# Patient Record
Sex: Male | Born: 1990 | Race: White | Hispanic: No | Marital: Single | Smoking: Current every day smoker
Health system: Southern US, Community
[De-identification: ages and names within clinical notes are randomized; demographics above are authoritative.]

## PROBLEM LIST (undated history)

## (undated) DIAGNOSIS — K529 Noninfective gastroenteritis and colitis, unspecified: Secondary | ICD-10-CM

## (undated) HISTORY — PX: HERNIA REPAIR: SHX51

---

## 2017-07-16 ENCOUNTER — Encounter (HOSPITAL_COMMUNITY): Payer: Self-pay

## 2017-07-16 ENCOUNTER — Emergency Department (HOSPITAL_COMMUNITY): Payer: Managed Care, Other (non HMO)

## 2017-07-16 ENCOUNTER — Emergency Department (HOSPITAL_COMMUNITY)
Admission: EM | Admit: 2017-07-16 | Discharge: 2017-07-16 | Disposition: A | Discharge status: Home / Self Care | Payer: Managed Care, Other (non HMO) | Attending: Emergency Medicine | Admitting: Emergency Medicine

## 2017-07-16 DIAGNOSIS — Z79899 Other long term (current) drug therapy: Secondary | ICD-10-CM | POA: Insufficient documentation

## 2017-07-16 DIAGNOSIS — R110 Nausea: Secondary | ICD-10-CM | POA: Insufficient documentation

## 2017-07-16 DIAGNOSIS — R1031 Right lower quadrant pain: Secondary | ICD-10-CM | POA: Insufficient documentation

## 2017-07-16 DIAGNOSIS — F17200 Nicotine dependence, unspecified, uncomplicated: Secondary | ICD-10-CM | POA: Diagnosis not present

## 2017-07-16 HISTORY — DX: Noninfective gastroenteritis and colitis, unspecified: K52.9

## 2017-07-16 LAB — COMPREHENSIVE METABOLIC PANEL
ALBUMIN: 4.6 g/dL (ref 3.5–5.0)
ALT: 24 U/L (ref 17–63)
ANION GAP: 10 (ref 5–15)
AST: 25 U/L (ref 15–41)
Alkaline Phosphatase: 76 U/L (ref 38–126)
BUN: 10 mg/dL (ref 6–20)
CHLORIDE: 107 mmol/L (ref 101–111)
CO2: 24 mmol/L (ref 22–32)
Calcium: 9.7 mg/dL (ref 8.9–10.3)
Creatinine, Ser: 0.83 mg/dL (ref 0.61–1.24)
GFR calc Af Amer: >60 mL/min (ref >60)
GFR calc non Af Amer: >60 mL/min (ref >60)
GLUCOSE: 98 mg/dL (ref 65–99)
POTASSIUM: 4.1 mmol/L (ref 3.5–5.1)
SODIUM: 141 mmol/L (ref 135–145)
Total Bilirubin: 0.6 mg/dL (ref 0.3–1.2)
Total Protein: 7.7 g/dL (ref 6.5–8.1)

## 2017-07-16 LAB — URINALYSIS, ROUTINE W REFLEX MICROSCOPIC
BILIRUBIN URINE: NEGATIVE
GLUCOSE, UA: NEGATIVE mg/dL
Hgb urine dipstick: NEGATIVE
KETONES UR: NEGATIVE mg/dL
LEUKOCYTES UA: NEGATIVE
Nitrite: NEGATIVE
PH: 7 (ref 5.0–8.0)
Protein, ur: NEGATIVE mg/dL
SPECIFIC GRAVITY, URINE: 1.023 (ref 1.005–1.030)

## 2017-07-16 LAB — CBC
HEMATOCRIT: 41.6 % (ref 39.0–52.0)
HEMOGLOBIN: 14.9 g/dL (ref 13.0–17.0)
MCH: 30.6 pg (ref 26.0–34.0)
MCHC: 35.8 g/dL (ref 30.0–36.0)
MCV: 85.4 fL (ref 78.0–100.0)
Platelets: 333 10*3/uL (ref 150–400)
RBC: 4.87 MIL/uL (ref 4.22–5.81)
RDW: 12.8 % (ref 11.5–15.5)
WBC: 12 10*3/uL — ABNORMAL HIGH (ref 4.0–10.5)

## 2017-07-16 LAB — LIPASE, BLOOD: LIPASE: 24 U/L (ref 11–51)

## 2017-07-16 MED ORDER — ONDANSETRON HCL 4 MG PO TABS: 4.0000 mg | ORAL_TABLET | Freq: Three times a day (TID) | ORAL | 0 refills | Status: AC | PRN

## 2017-07-16 MED ORDER — HYDROCODONE-ACETAMINOPHEN 5-325 MG PO TABS: 1.0000 | ORAL_TABLET | ORAL | 0 refills | Status: DC | PRN

## 2017-07-16 MED ORDER — IOHEXOL 300 MG/ML  SOLN: 100.0000 mL | Freq: Once | INTRAMUSCULAR | Status: DC | PRN

## 2017-07-16 MED ORDER — ONDANSETRON 4 MG PO TBDP
4.0000 mg | ORAL_TABLET | Freq: Once | ORAL | Status: AC | PRN
  Administered 2017-07-16: 4 mg via ORAL
  Filled 2017-07-16: qty 1

## 2017-07-16 MED ORDER — IOHEXOL 300 MG/ML  SOLN
100.0000 mL | Freq: Once | INTRAMUSCULAR | Status: AC | PRN
  Administered 2017-07-16: 100 mL via INTRAVENOUS

## 2017-07-16 NOTE — ED Provider Notes (Signed)
Patient placed in Quick Look pathway, seen and evaluated   Chief Complaint: Lower abdominal pain and headache  HPI: Patient with history of hernia surgery, colitis with last flare 6 years ago --presents with acute onset of right lower quadrant pain.  Pain is sharp and shooting in nature.  It does not radiate.  It became worse this morning.  [facility] and was encouraged to come to the emergency department for rule out appendicitis.  Patient has nausea but no vomiting.  No fevers, urinary symptoms.  He also complains of a generalized headache, similar to previous.  He states that he typically will take Excedrin when he gets headaches.  ROS:  Positive ROS: (+) Abdominal pain, nausea, headache Negative ROS: (-) Vomiting, dysuria, hematuria, diarrhea  Physical Exam:   Gen: No distress  Neuro: Awake and Alert  Skin: Warm    Focused Exam: Heart RRR, nml S1,S2, no m/r/g; Eyes PERRL; Lungs CTAB; Abd soft, mild right lower quadrant pain, no rebound or guarding; Ext 2+ pedal pulses bilaterally, no edema.  BP 131/86 (BP Location: Right Arm)   Pulse 82   Temp 97.7 F (36.5 C) (Oral)   Resp 18   Ht  (1.753 m)   Wt 79.4 kg (175 lb)   SpO2 100%   BMI 25.84 kg/m   Plan: Pain is somewhat atypical for appendicitis however given patient's history of colitis and hernia repair, feel CT imaging is warranted.  CT ordered.  Labs pending.   Initiation of care has begun. The patient has been counseled on the process, plan, and necessity for staying for the completion/evaluation, and the remainder of the medical screening examination    Renne Crigler, Cordelia Poche 07/16/17 1532    Eber Hong, MD 07/17/17 [phone]

## 2017-07-16 NOTE — ED Provider Notes (Signed)
[facility] EMERGENCY DEPARTMENT Provider Note   CSN: [id number] Arrival date & time: 07/16/17  1456     History   Chief Complaint Chief Complaint  Patient presents with  . Abdominal Pain    HPI Lance Foster is a 27 y.o. male.   The history is provided by the patient and medical records.  Abdominal Pain   This is a new problem. The current episode started 12 to 24 hours ago. The problem occurs constantly. The problem has been resolved. The pain is associated with an unknown factor. The pain is located in the RLQ. The quality of the pain is aching and sharp. The pain is moderate. Associated symptoms include nausea. Pertinent negatives include anorexia, fever, diarrhea, vomiting, constipation, dysuria, frequency and hematuria. Nothing aggravates the symptoms. His past medical history does not include GERD or Crohn's disease.    Past Medical History:  Diagnosis Date  . Colitis     There are no active problems to display for this patient.   Past Surgical History:  Procedure Laterality Date  . HERNIA REPAIR          Home Medications    Prior to Admission medications   Medication Sig Start Date End Date Taking? Authorizing Provider  aspirin-acetaminophen-caffeine (EXCEDRIN MIGRAINE) [phone] MG tablet Take 1-2 tablets by mouth every 6 (six) hours as needed (for pain or headaches).    Yes [provider]  multivitamin (ONE-A-DAY MEN'S) TABS tablet Take 1 tablet by mouth daily.   Yes [provider]    Family History History reviewed. No pertinent family history.    Social History Social History   Tobacco Use  . Smoking status: Current Every Day Smoker  . Smokeless tobacco: Never Used  Substance Use Topics  . Alcohol use: Not on file  . Drug use: Not on file     Allergies   Patient has no known allergies.   Review of Systems Review of Systems  Constitutional: Negative for chills, diaphoresis, fatigue and fever.    HENT: Negative for congestion and rhinorrhea.   Respiratory: Negative for cough, choking, chest tightness, shortness of breath and wheezing.   Cardiovascular: Negative for chest pain and palpitations.  Gastrointestinal: Positive for abdominal pain and nausea. Negative for anorexia, constipation, diarrhea and vomiting.  Genitourinary: Negative for dysuria, flank pain, frequency and hematuria.  Musculoskeletal: Negative for back pain, neck pain and neck stiffness.  Skin: Negative for rash and wound.  Neurological: Negative for dizziness, weakness, light-headedness and numbness.  Psychiatric/Behavioral: Negative for agitation and confusion.  All other systems reviewed and are negative.    Physical Exam Updated Vital Signs BP (!) 147/106   Pulse (!) 53   Temp 98.5 F (36.9 C) (Oral)   Resp 16   Ht  (1.753 m)   Wt 79.4 kg (175 lb)   SpO2 99%   BMI 25.84 kg/m   Physical Exam  Constitutional: He is oriented to person, place, and time. He appears well-developed and well-nourished.  Non-toxic appearance. He does not appear ill. No distress.  HENT:  Head: Normocephalic.  Nose: Nose normal.  Mouth/Throat: Oropharynx is clear and moist. No oropharyngeal exudate.  Eyes: Pupils are equal, round, and reactive to light. Conjunctivae and EOM are normal.  Neck: Normal range of motion.  Cardiovascular: Normal rate and intact distal pulses.  No murmur heard. Pulmonary/Chest: Effort normal and breath sounds normal. No respiratory distress. He has no wheezes. He exhibits no tenderness.  Abdominal: Soft. Bowel sounds  are normal. He exhibits no distension. There is no tenderness. There is no rebound and no guarding.  Genitourinary:  Genitourinary Comments: Refused   Musculoskeletal: He exhibits no edema or tenderness.  Neurological: He is alert and oriented to person, place, and time. No sensory deficit. He exhibits normal muscle tone.  Skin: Skin is warm. Capillary refill takes less than 2  seconds. He is not diaphoretic. No erythema. No pallor.  Psychiatric: He has a normal mood and affect.  Nursing note and vitals reviewed.    ED Treatments / Results  Labs (all labs ordered are listed, but only abnormal results are displayed) Labs Reviewed  CBC - Abnormal; Notable for the following components:      Result Value   WBC 12.0 (*)    All other components within normal limits  LIPASE, BLOOD  COMPREHENSIVE METABOLIC PANEL  URINALYSIS, ROUTINE W REFLEX MICROSCOPIC    EKG None  Radiology Ct Abdomen Pelvis W Contrast  Result Date: 07/16/2017 CLINICAL DATA:  RIGHT lower quadrant pain. Evaluate for appendicitis. EXAM: CT ABDOMEN AND PELVIS WITH CONTRAST TECHNIQUE: Multidetector CT imaging of the abdomen and pelvis was performed using the standard protocol following bolus administration of intravenous contrast. CONTRAST:  OMNIPAQUE IOHEXOL 300 MG/ML  SOLN COMPARISON:  None. FINDINGS: Lower chest: No acute abnormality. Hepatobiliary: No focal liver abnormality is seen. No gallstones, gallbladder wall thickening, or biliary dilatation. Pancreas: Unremarkable. No pancreatic ductal dilatation or surrounding inflammatory changes. Spleen: Normal in size without focal abnormality. Adrenals/Urinary Tract: Adrenal glands are unremarkable. Kidneys are normal, without renal calculi, focal lesion, or hydronephrosis. Bladder is unremarkable. Stomach/Bowel: Stomach is within normal limits. Appendix appears normal. No evidence of bowel wall thickening, distention, or inflammatory changes. Changes of colonic diverticulosis without diverticulitis. Vascular/Lymphatic: No significant vascular findings are present. No enlarged abdominal or pelvic lymph nodes. Reproductive: Prostate is unremarkable. Other: No abdominal wall hernia or abnormality. No abdominopelvic ascites. Musculoskeletal: No acute or significant osseous findings. IMPRESSION: No evidence for appendicitis. No acute intra-abdominal or  pelvic findings. Electronically Signed   By: Elsie Stain M.D.   On: 07/16/2017 18:46    Procedures Procedures (including critical care time)  Medications Ordered in ED Medications  iohexol (OMNIPAQUE) 300 MG/ML solution 100 mL (has no administration in time range)  ondansetron (ZOFRAN-ODT) disintegrating tablet 4 mg (4 mg Oral Given 07/16/17 1520)  iohexol (OMNIPAQUE) 300 MG/ML solution 100 mL (100 mLs Intravenous Contrast Given 07/16/17 1756)     Initial Impression / Assessment and Plan / ED Course  I have reviewed the triage vital signs and the nursing notes.  Pertinent labs & imaging results that were available during my care of the patient were reviewed by me and considered in my medical decision making (see chart for details).     Lance Foster is a 27 y.o. male with a past medical history significant for prior colitis and prior hernia repair who presents with right lower quadrant abdominal pain.  Patient reports that he began having pain this morning that worsened at work.  He reports that he went to an urgent care and then was sent to the emergency department for appendicitis rule out.  Patient reports of nausea but no vomiting.  He denies fevers, chills, chest pain, shortness breath, constipation, or diarrhea.  He denies any urinary symptoms.  He denies recent trauma.  He described his pain is cramping and sharp in his right lower quadrant primarily.  He denies any testicle pain or groin pain.  On exam,  patient had no significant right lower quadrant tenderness.  Abdomen was otherwise nontender.  No CVA tenderness.  Lungs clear.  Chest nontender.  Patient had work-up started in triage including lab tests and CT scan to rule out meningitis.    Lab testing reassuring and CT scan showing no acute intra-abdominal or pelvic pathology.  No appendicitis, colitis, diverticulitis, or obstruction.  No other significant abnormality seen.    Patient reassessed and had no further pain.   Unclear etiology of the symptoms the patient does report he bends over and across her crawl spaces at work.  Possible muscular skeletal etiology of symptoms.  Given improvement in symptoms after medications, patient be given prescription for nausea medicine and several doses of pain medication.  Patient will follow with PCP and understood return precautions.  Patient no other questions or concerns and patient was discharged in good condition with improving symptoms.   Final Clinical Impressions(s) / ED Diagnoses   Final diagnoses:  Right lower quadrant abdominal pain  Nausea    ED Discharge Orders        Ordered    ondansetron (ZOFRAN) 4 MG tablet  Every 8 hours PRN     07/16/17 2117    HYDROcodone-acetaminophen (NORCO/VICODIN) 5-325 MG tablet  Every 4 hours PRN     07/16/17 2117      Clinical Impression: 1. Right lower quadrant abdominal pain   2. Nausea     Disposition: Discharge  Condition: Good  I have discussed the results, Dx and Tx plan with the pt(& family if present). He/she/they expressed understanding and agree(s) with the plan. Discharge instructions discussed at great length. Strict return precautions discussed and pt &/or family have verbalized understanding of the instructions. No further questions at time of discharge.    New Prescriptions   HYDROCODONE-ACETAMINOPHEN (NORCO/VICODIN) 5-325 MG TABLET    Take 1 tablet by mouth every 4 (four) hours as needed.   ONDANSETRON (ZOFRAN) 4 MG TABLET    Take 1 tablet (4 mg total) by mouth every 8 (eight) hours as needed.    Follow Up: [facility] [address] [place] [address] [phone] Schedule an appointment as soon as possible for a visit    [facility] EMERGENCY DEPARTMENT [address] [id number] mc [place] [address] [phone]       Clova Morlock, Canary Brim, MD 07/16/17 2126

## 2017-07-16 NOTE — Discharge Instructions (Signed)
Your imaging today was overall reassuring.  We do not feel you have an obstruction, infection, diverticulitis, appendicitis, or gallbladder troubles.  Please take the nausea medicine and pain medicine to help with your symptoms and stay hydrated.  Please follow-up with a primary care physician.  If any symptoms change or worsen, please return the nearest emergency department.

## 2017-07-16 NOTE — ED Triage Notes (Signed)
Pt presents with onset of RLQ abdominal pain that began this morning.  Pt reports nausea and headache; seen at urgent care and referred here to r/o appendicitis.

## 2017-07-19 ENCOUNTER — Encounter: Payer: Self-pay | Admitting: Urgent Care

## 2017-07-19 ENCOUNTER — Other Ambulatory Visit: Payer: Self-pay

## 2017-07-19 ENCOUNTER — Ambulatory Visit (INDEPENDENT_AMBULATORY_CARE_PROVIDER_SITE_OTHER): Payer: Managed Care, Other (non HMO) | Admitting: Urgent Care

## 2017-07-19 VITALS — BP 140/82 | HR 70 | Temp 98.0°F | Resp 16 | Ht 69.0 in | Wt 172.0 lb

## 2017-07-19 DIAGNOSIS — K5730 Diverticulosis of large intestine without perforation or abscess without bleeding: Secondary | ICD-10-CM | POA: Diagnosis not present

## 2017-07-19 DIAGNOSIS — R110 Nausea: Secondary | ICD-10-CM

## 2017-07-19 DIAGNOSIS — R1084 Generalized abdominal pain: Secondary | ICD-10-CM

## 2017-07-19 DIAGNOSIS — Z800 Family history of malignant neoplasm of digestive organs: Secondary | ICD-10-CM

## 2017-07-19 DIAGNOSIS — R197 Diarrhea, unspecified: Secondary | ICD-10-CM | POA: Diagnosis not present

## 2017-07-19 DIAGNOSIS — D72829 Elevated white blood cell count, unspecified: Secondary | ICD-10-CM

## 2017-07-19 DIAGNOSIS — F1010 Alcohol abuse, uncomplicated: Secondary | ICD-10-CM

## 2017-07-19 LAB — POCT CBC
GRANULOCYTE PERCENT: 58.5 % (ref 37–80)
HCT, POC: 41.3 % — AB (ref 43.5–53.7)
Hemoglobin: 13.8 g/dL — AB (ref 14.1–18.1)
LYMPH, POC: 2.7 (ref 0.6–3.4)
MCH, POC: 29.3 pg (ref 27–31.2)
MCHC: 33.5 g/dL (ref 31.8–35.4)
MCV: 87.6 fL (ref 80–97)
MID (CBC): 0.8 (ref 0–0.9)
MPV: 7.2 fL (ref 0–99.8)
PLATELET COUNT, POC: 321 10*3/uL (ref 142–424)
POC Granulocyte: 4.9 (ref 2–6.9)
POC LYMPH %: 32.4 % (ref 10–50)
POC MID %: 9.1 %M (ref 0–12)
RBC: 4.71 M/uL (ref 4.69–6.13)
RDW, POC: 12.7 %
WBC: 8.4 10*3/uL (ref 4.6–10.2)

## 2017-07-19 LAB — POCT URINALYSIS DIP (MANUAL ENTRY)
BILIRUBIN UA: NEGATIVE mg/dL
Bilirubin, UA: NEGATIVE
Glucose, UA: NEGATIVE mg/dL
LEUKOCYTES UA: NEGATIVE
Nitrite, UA: NEGATIVE
PH UA: 7 (ref 5.0–8.0)
Protein Ur, POC: NEGATIVE mg/dL
RBC UA: NEGATIVE
Spec Grav, UA: 1.02 (ref 1.010–1.025)
Urobilinogen, UA: 0.2 E.U./dL

## 2017-07-19 NOTE — Progress Notes (Signed)
MRN: [id number] DOB: [birth year]  Subjective:   L[facility] Foster is a 27 y.o. male presenting for nausea without vomiting, abdominal pain, loose stools. He has a history of diverticulosis without diverticulitis on 07/16/2017 as seen on abdominal CT. Patient has bouts of diarrhea and hard stools, constipation, has to strain at times to defecate. He has also had trouble with hemorrhoids. Patient just moved to the area ~1 year ago. He does not have a GI doctor. Admits that he is not actively eating healthily.  Denies fever, vomiting, bloody stools.  He does have family history, an uncle, that had colon cancer.  Is worried about his work as well given that he is had to miss some days due to his abdominal pain and may need FMLA paperwork.  Smoke cigarettes currently. Drinks a 24 pack of beer over 2-day weekend consistently.  His family history is also positive for diabetes and hyperlipidemia in his maternal grandfather.  Lance Foster has a current medication list which includes the following prescription(s): multivitamin and ondansetron. Also has No Known Allergies.  Lance Foster  has a past medical history of Colitis. Also  has a past surgical history that includes Hernia repair.  Objective:   Vitals: BP 140/82   Pulse 70   Temp 98 F (36.7 C) (Oral)   Resp 16   Ht  (1.753 m)   Wt 172 lb (78 kg)   SpO2 97%   BMI 25.40 kg/m   Physical Exam  Constitutional: He is oriented to person, place, and time. He appears well-developed and well-nourished.  HENT:  Mouth/Throat: Oropharynx is clear and moist.  Cardiovascular: Normal rate, regular rhythm and intact distal pulses. Exam reveals no gallop and no friction rub.  No murmur heard. Pulmonary/Chest: No respiratory distress. He has no wheezes. He has no rales.  Abdominal: There is no hepatosplenomegaly. There is tenderness in the right lower quadrant. There is no rigidity, no rebound, no guarding, no CVA tenderness, no tenderness at  McBurney's point and negative Murphy's sign.  Neurological: He is alert and oriented to person, place, and time.  Skin: Skin is warm and dry.  Psychiatric: He has a normal mood and affect.   Results for orders placed or performed in visit on 07/19/17 (from the past 24 hour(s))  POCT urinalysis dipstick     Status: None   Collection Time: 07/19/17  3:42 PM  Result Value Ref Range   Color, UA yellow yellow   Clarity, UA clear clear   Glucose, UA negative negative mg/dL   Bilirubin, UA negative negative   Ketones, POC UA negative negative mg/dL   Spec Grav, UA 4.098 1.191 - 1.025   Blood, UA negative negative   pH, UA 7.0 5.0 - 8.0   Protein Ur, POC negative negative mg/dL   Urobilinogen, UA 0.2 0.2 or 1.0 E.U./dL   Nitrite, UA Negative Negative   Leukocytes, UA Negative Negative  POCT CBC     Status: Abnormal   Collection Time: 07/19/17  4:20 PM  Result Value Ref Range   WBC 8.4 4.6 - 10.2 K/uL   Lymph, poc 2.7 0.6 - 3.4   POC LYMPH PERCENT 32.4 10 - 50 %L   MID (cbc) 0.8 0 - 0.9   POC MID % 9.1 0 - 12 %M   POC Granulocyte 4.9 2 - 6.9   Granulocyte percent 58.5 37 - 80 %G   RBC 4.71 4.69 - 6.13 M/uL   Hemoglobin 13.8 (A) 14.1 - 18.1 g/dL  HCT, POC 41.3 (A) 43.5 - 53.7 %   MCV 87.6 80 - 97 fL   MCH, POC 29.3 27 - 31.2 pg   MCHC 33.5 31.8 - 35.4 g/dL   RDW, POC 16.1 %   Platelet Count, POC 321 142 - 424 K/uL   MPV 7.2 0 - 99.8 fL    Assessment and Plan :   Diarrhea, unspecified type - Plan: POCT urinalysis dipstick, Ambulatory referral to Gastroenterology  Nausea without vomiting - Plan: Ambulatory referral to Gastroenterology  Generalized abdominal pain - Plan: Ambulatory referral to Gastroenterology, POCT CBC  Leukocytosis, unspecified type  Diverticulosis of colon - Plan: Ambulatory referral to Gastroenterology, POCT CBC  Family history of colon cancer  Alcohol abuse  Counseled on significant need for dietary modifications including a high-fiber diet,  adequate hydration and abstaining from alcohol.  Referral to GI is pending.  Return to clinic precautions discussed.  I'd be happy to help patient with his FMLA paperwork if he ends up needing this.  Wallis Bamberg, PA-C [facility] [phone] 07/19/2017  3:34 PM

## 2017-07-19 NOTE — Patient Instructions (Addendum)
Diverticulosis Diverticulosis is a condition that develops when small pouches (diverticula) form in the wall of the large intestine (colon). The colon is where water is absorbed and stool is formed. The pouches form when the inside layer of the colon pushes through weak spots in the outer layers of the colon. You may have a few pouches or many of them. What are the causes? The cause of this condition is not known. What increases the risk? The following factors may make you more likely to develop this condition:  Being older than age 80. Your risk for this condition increases with age. Diverticulosis is rare among people younger than age 64. By age 48, many people have it.  Eating a low-fiber diet.  Having frequent constipation.  Being overweight.  Not getting enough exercise.  Smoking.  Taking over-the-counter pain medicines, like aspirin and ibuprofen.  Having a family history of diverticulosis.  What are the signs or symptoms? In most people, there are no symptoms of this condition. If you do have symptoms, they may include:  Bloating.  Cramps in the abdomen.  Constipation or diarrhea.  Pain in the lower left side of the abdomen.  How is this diagnosed? This condition is most often diagnosed during an exam for other colon problems. Because diverticulosis usually has no symptoms, it often cannot be diagnosed independently. This condition may be diagnosed by:  Using a flexible scope to examine the colon (colonoscopy).  Taking an X-ray of the colon after dye has been put into the colon (barium enema).  Doing a CT scan.  How is this treated? You may not need treatment for this condition if you have never developed an infection related to diverticulosis. If you have had an infection before, treatment may include:  Eating a high-fiber diet. This may include eating more fruits, vegetables, and grains.  Taking a fiber supplement.  Taking a live bacteria supplement  (probiotic).  Taking medicine to relax your colon.  Taking antibiotic medicines.  Follow these instructions at home:  Drink 6-8 glasses of water or more each day to prevent constipation.  Try not to strain when you have a bowel movement.  If you have had an infection before: ? Eat more fiber as directed by your health care provider or your diet and nutrition specialist (dietitian). ? Take a fiber supplement or probiotic, if your health care provider approves.  Take over-the-counter and prescription medicines only as told by your health care provider.  If you were prescribed an antibiotic, take it as told by your health care provider. Do not stop taking the antibiotic even if you start to feel better.  Keep all follow-up visits as told by your health care provider. This is important. Contact a health care provider if:  You have pain in your abdomen.  You have bloating.  You have cramps.  You have not had a bowel movement in 3 days. Get help right away if:  Your pain gets worse.  Your bloating becomes very bad.  You have a fever or chills, and your symptoms suddenly get worse.  You vomit.  You have bowel movements that are bloody or black.  You have bleeding from your rectum. Summary  Diverticulosis is a condition that develops when small pouches (diverticula) form in the wall of the large intestine (colon).  You may have a few pouches or many of them.  This condition is most often diagnosed during an exam for other colon problems.  If you have had an  infection related to diverticulosis, treatment may include increasing the fiber in your diet, taking supplements, or taking medicines. This information is not intended to replace advice given to you by your health care provider. Make sure you discuss any questions you have with your health care provider. Document Released: 11/24/2003 Document Revised: 01/16/2016 Document Reviewed: 01/16/2016 Elsevier Interactive  Patient Education  2017 [employer].     IF you received an x-ray today, you will receive an invoice from [facility]. Please contact [facility] at [phone] with questions or concerns regarding your invoice.   IF you received labwork today, you will receive an invoice from [facility]. Please contact [facility] at [phone] with questions or concerns regarding your invoice.   Our billing staff will not be able to assist you with questions regarding bills from these companies.  You will be contacted with the lab results as soon as they are available. The fastest way to get your results is to activate your My Chart account. Instructions are located on the last page of this paperwork. If you have not heard from [place] regarding the results in 2 weeks, please contact this office.

## 2017-08-02 ENCOUNTER — Telehealth: Payer: Self-pay | Admitting: Urgent Care

## 2017-08-02 NOTE — Telephone Encounter (Signed)
Pt needs form filled out for work concerning his condition.  I have left the forms in your box at the nurse desk.  Call patient at [phone] when ready to pick up.

## 2017-08-09 NOTE — Telephone Encounter (Signed)
I will complete the forms once again placed in my box.  Thank you.

## 2017-08-14 NOTE — Telephone Encounter (Signed)
Called and left pt a VM for him to call the office and make an OV with Urban GibsonMani so he can complete the forms. When pt calls back, please schedule him an OV with Wallis BambergMario Mani at his convenience.   Thanks!

## 2017-08-23 ENCOUNTER — Encounter: Payer: Self-pay | Admitting: Urgent Care

## 2017-08-23 ENCOUNTER — Ambulatory Visit (INDEPENDENT_AMBULATORY_CARE_PROVIDER_SITE_OTHER): Payer: Managed Care, Other (non HMO) | Admitting: Urgent Care

## 2017-08-23 VITALS — BP 111/72 | HR 95 | Temp 97.9°F | Resp 18 | Ht 69.0 in | Wt 171.0 lb

## 2017-08-23 DIAGNOSIS — F1010 Alcohol abuse, uncomplicated: Secondary | ICD-10-CM | POA: Diagnosis not present

## 2017-08-23 DIAGNOSIS — K5790 Diverticulosis of intestine, part unspecified, without perforation or abscess without bleeding: Secondary | ICD-10-CM | POA: Diagnosis not present

## 2017-08-23 DIAGNOSIS — Z8719 Personal history of other diseases of the digestive system: Secondary | ICD-10-CM | POA: Diagnosis not present

## 2017-08-23 NOTE — Progress Notes (Signed)
    MRN: [id number] DOB: [birth year]  Subjective:   Lance Foster is a 27 y.o. male presenting for follow up on FMLA paperwork for history of diverticulitis, abdominal pain and diverticulosis.  I initially saw patient on 07/19/2017, patient requested that I help him with his paperwork so that he did not get fired from his job for missing days in which she was having acute abdominal pain.  He underwent hospitalization for 5 days in 2014 for diverticulitis.  Reported to me that since then he had been drinking alcohol pretty consistently and heavily, was not practicing healthy diet and was not getting much fiber.  Today after our last office visit reports that he has made significant lifestyle changes in this regard.  He states that he does not require special accommodations on a day-to-day basis for his work but would like to be able to call out in case he needs to have an office visit to rule out diverticulitis.  He has not had a recurrence of abdominal pain, bloody stools, fever, nausea, vomiting.  Lance Foster has a current medication list which includes the following prescription(s): multivitamin and ondansetron. Also has No Known Allergies.  Lance Foster  has a past medical history of Colitis. Also  has a past surgical history that includes Hernia repair.  Objective:   Vitals: BP 111/72   Pulse 95   Temp 97.9 F (36.6 C) (Oral)   Resp 18   Ht 5\' 9"  (1.753 m)   Wt 171 lb (77.6 kg)   SpO2 98%   BMI 25.25 kg/m   Physical Exam  Constitutional: He is oriented to person, place, and time. He appears well-developed and well-nourished.  Cardiovascular: Normal rate.  Pulmonary/Chest: Effort normal.  Neurological: He is alert and oriented to person, place, and time.   Assessment and Plan :   History of diverticulitis of colon  Diverticulosis  Alcohol abuse  We discussed his FMLA paperwork and the nature of diverticulitis.  I counseled that this may not be a permanent condition; however,  diverticulosis is pretty set.  Counseled that the best case scenario is that he practice a healthy diet going forward to avoid any  recurrence of diverticulitis.  However, I did counsel that it is not always full proof.  For now, we agreed to complete his FMLA paperwork to accommodate the next 3 months as he makes lifestyle changes.  The FMLA should accommodate missed work time for diagnosis and treatment of acute diverticulitis.  Follow-up in August 2019.  Wallis BambergMario Alexey Rhoads, PA-C [facility] [phone] 08/23/2017 10:19 AM

## 2017-08-23 NOTE — Patient Instructions (Addendum)
Diverticulitis Diverticulitis is infection or inflammation of small pouches (diverticula) in the colon that form due to a condition called diverticulosis. Diverticula can trap stool (feces) and bacteria, causing infection and inflammation. Diverticulitis may cause severe stomach pain and diarrhea. It may lead to tissue damage in the colon that causes bleeding. The diverticula may also burst (rupture) and cause infected stool to enter other areas of the abdomen. Complications of diverticulitis can include:  Bleeding.  Severe infection.  Severe pain.  Rupture (perforation) of the colon.  Blockage (obstruction) of the colon.  What are the causes? This condition is caused by stool becoming trapped in the diverticula, which allows bacteria to grow in the diverticula. This leads to inflammation and infection. What increases the risk? You are more likely to develop this condition if:  You have diverticulosis. The risk for diverticulosis increases if: ? You are overweight or obese. ? You use tobacco products. ? You do not get enough exercise.  You eat a diet that does not include enough fiber. High-fiber foods include fruits, vegetables, beans, nuts, and whole grains.  What are the signs or symptoms? Symptoms of this condition may include:  Pain and tenderness in the abdomen. The pain is normally located on the left side of the abdomen, but it may occur in other areas.  Fever and chills.  Bloating.  Cramping.  Nausea.  Vomiting.  Changes in bowel routines.  Blood in your stool.  How is this diagnosed? This condition is diagnosed based on:  Your medical history.  A physical exam.  Tests to make sure there is nothing else causing your condition. These tests may include: ? Blood tests. ? Urine tests. ? Imaging tests of the abdomen, including X-rays, ultrasounds, MRIs, or CT scans.  How is this treated? Most cases of this condition are mild and can be treated at home.  Treatment may include:  Taking over-the-counter pain medicines.  Following a clear liquid diet.  Taking antibiotic medicines by mouth.  Rest.  More severe cases may need to be treated at a hospital. Treatment may include:  Not eating or drinking.  Taking prescription pain medicine.  Receiving antibiotic medicines through an IV tube.  Receiving fluids and nutrition through an IV tube.  Surgery.  When your condition is under control, your health care provider may recommend that you have a colonoscopy. This is an exam to look at the entire large intestine. During the exam, a lubricated, bendable tube is inserted into the anus and then passed into the rectum, colon, and other parts of the large intestine. A colonoscopy can show how severe your diverticula are and whether something else may be causing your symptoms. Follow these instructions at home: Medicines  Take over-the-counter and prescription medicines only as told by your health care provider. These include fiber supplements, probiotics, and stool softeners.  If you were prescribed an antibiotic medicine, take it as told by your health care provider. Do not stop taking the antibiotic even if you start to feel better.  Do not drive or use heavy machinery while taking prescription pain medicine. General instructions  Follow a full liquid diet or another diet as directed by your health care provider. After your symptoms improve, your health care provider may tell you to change your diet. He or she may recommend that you eat a diet that contains at least 25 g (25 grams) of fiber daily. Fiber makes it easier to pass stool. Healthy sources of fiber include: ? Berries. One cup   contains 4-8 grams of fiber. ? Beans or lentils. One half cup contains 5-8 grams of fiber. ? Green vegetables. One cup contains 4 grams of fiber.  Exercise for at least 30 minutes, 3 times each week. You should exercise hard enough to raise your heart rate and  break a sweat.  Keep all follow-up visits as told by your health care provider. This is important. You may need a colonoscopy. Contact a health care provider if:  Your pain does not improve.  You have a hard time drinking or eating food.  Your bowel movements do not return to normal. Get help right away if:  Your pain gets worse.  Your symptoms do not get better with treatment.  Your symptoms suddenly get worse.  You have a fever.  You vomit more than one time.  You have stools that are bloody, black, or tarry. Summary  Diverticulitis is infection or inflammation of small pouches (diverticula) in the colon that form due to a condition called diverticulosis. Diverticula can trap stool (feces) and bacteria, causing infection and inflammation.  You are at higher risk for this condition if you have diverticulosis and you eat a diet that does not include enough fiber.  Most cases of this condition are mild and can be treated at home. More severe cases may need to be treated at a hospital.  When your condition is under control, your health care provider may recommend that you have an exam called a colonoscopy. This exam can show how severe your diverticula are and whether something else may be causing your symptoms. This information is not intended to replace advice given to you by your health care provider. Make sure you discuss any questions you have with your health care provider. Document Released: 12/06/2004 Document Revised: 03/31/2016 Document Reviewed: 03/31/2016 [employer]vier Interactive Patient Education  2018 [employer]     IF you received an x-ray today, you will receive an invoice from [facility]. Please contact [facility] at [phone] with questions or concerns regarding your invoice.   IF you received labwork today, you will receive an invoice from [facility]. Please contact [facility] at [phone] with questions or concerns regarding your  invoice.   Our billing staff will not be able to assist you with questions regarding bills from these companies.  You will be contacted with the lab results as soon as they are available. The fastest way to get your results is to activate your My Chart account. Instructions are located on the last page of this paperwork. If you have not heard from us regarding the results in 2 weeks, please contact this office.      

## 2017-09-03 ENCOUNTER — Encounter: Payer: Self-pay | Admitting: Urgent Care

## 2018-11-28 IMAGING — CT CT ABD-PELV W/ CM
2 of 4 series · 17 of 46 positions shown, 19 images · IV contrast (APPLIED)
Comparison: None.

CLINICAL DATA: RIGHT lower quadrant pain. Evaluate for
appendicitis.

EXAM:
CT ABDOMEN AND PELVIS WITH CONTRAST
TECHNIQUE: Multidetector CT imaging of the abdomen and pelvis was performed
using the standard protocol following bolus administration of
intravenous contrast.
CONTRAST:  100mL OMNIPAQUE IOHEXOL 300 MG/ML  SOLN

[Series 3: abd/ pelvis 5.0 i30f 2 · axial · 0.67mm/px · z∈[+705,+1135]mm · 14 of 96 slices shown, 16 images]
[im 5/96  soft-tissue]
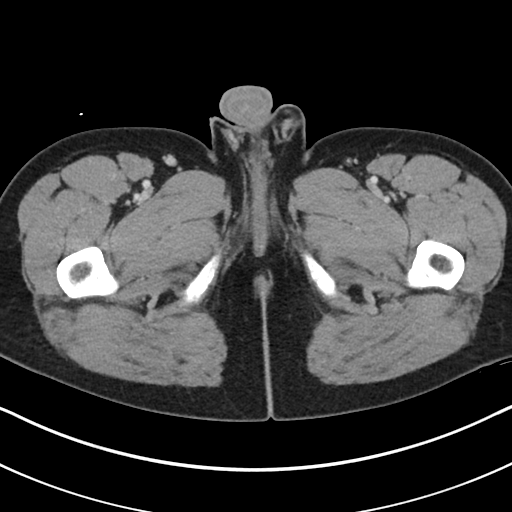
[im 5/96  bone]
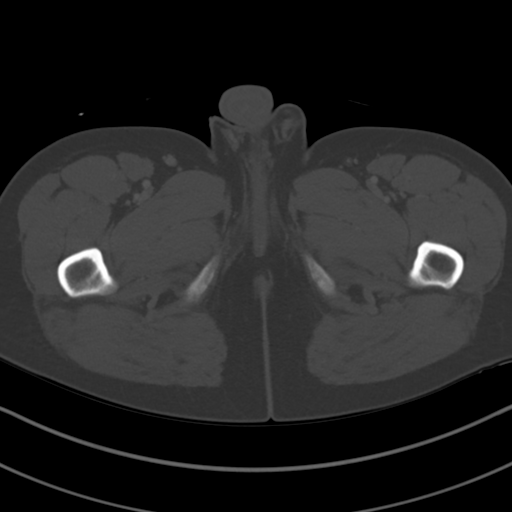
[im 13/96  soft-tissue]
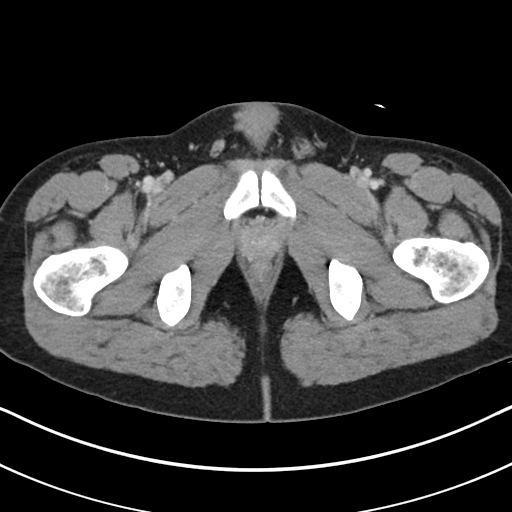
[im 17/96  soft-tissue]
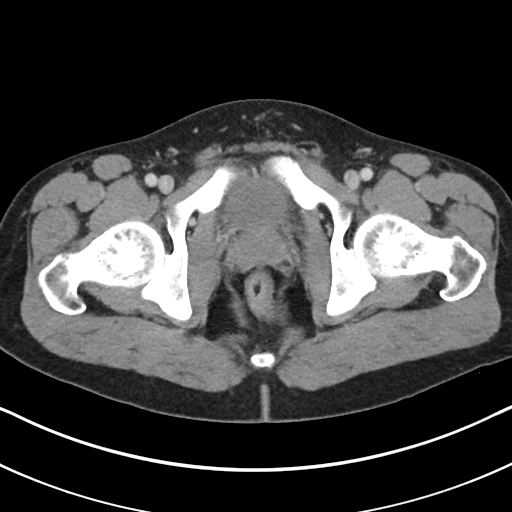
[im 25/96  soft-tissue]
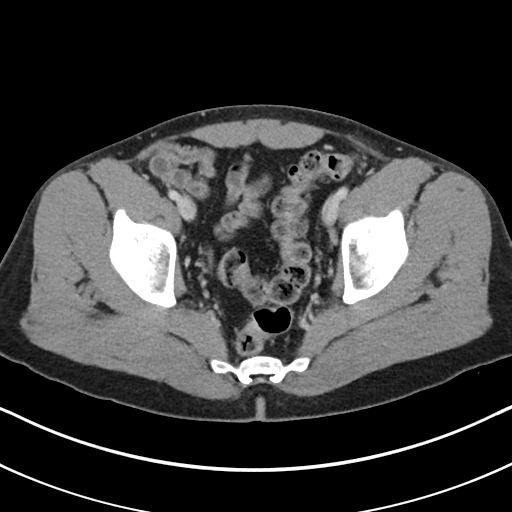
[im 34/96  soft-tissue]
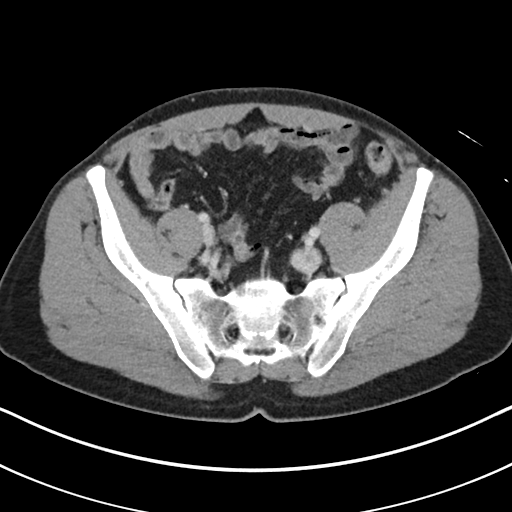
[im 38/96  soft-tissue]
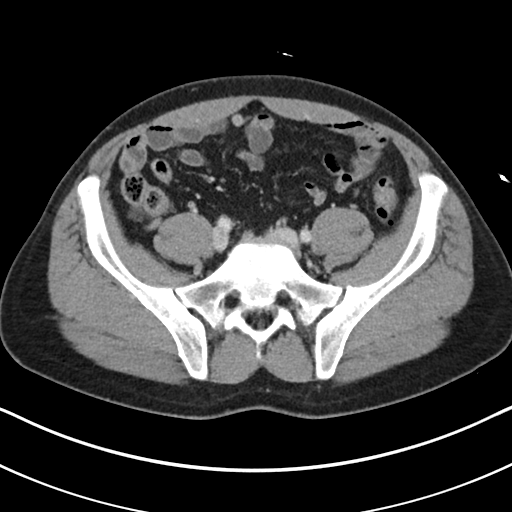
[im 46/96  soft-tissue]
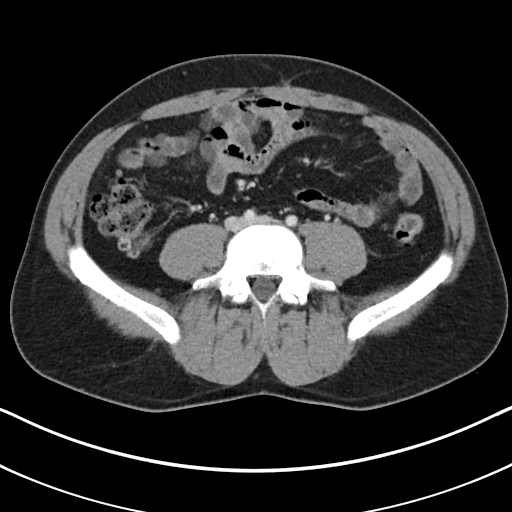
[im 50/96  soft-tissue]
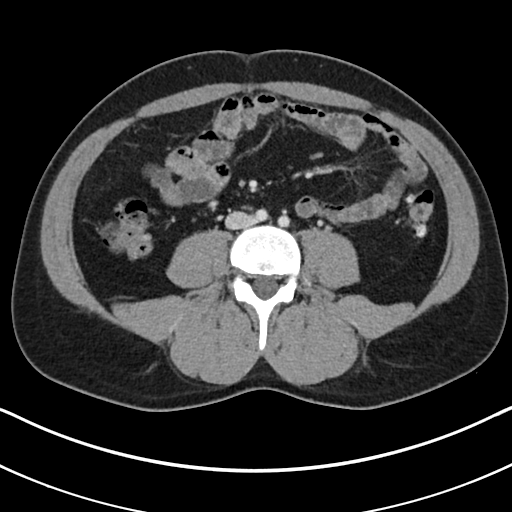
[im 58/96  soft-tissue]
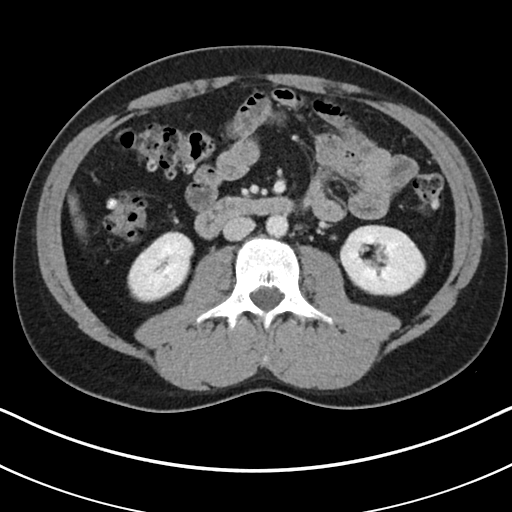
[im 58/96  bone]
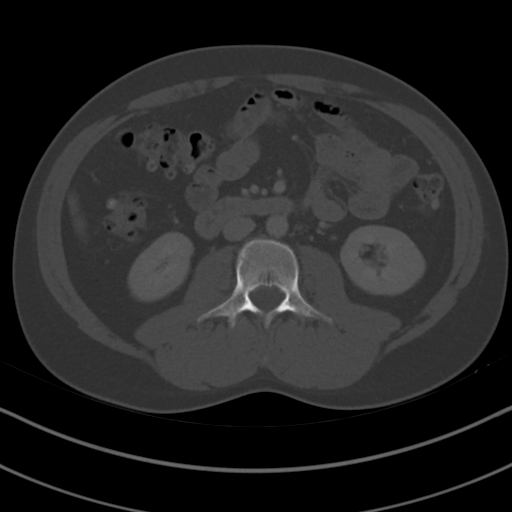
[im 62/96  soft-tissue]
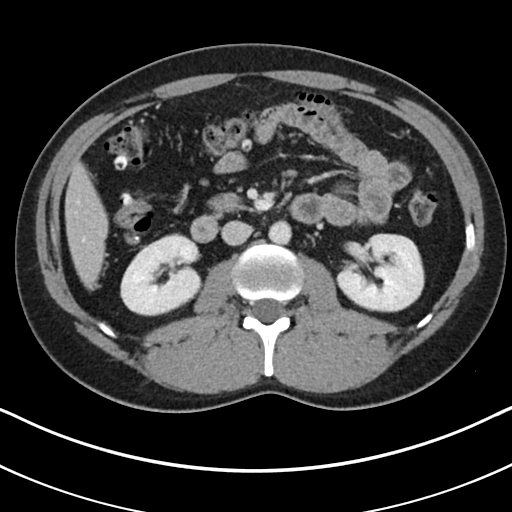
[im 71/96  soft-tissue]
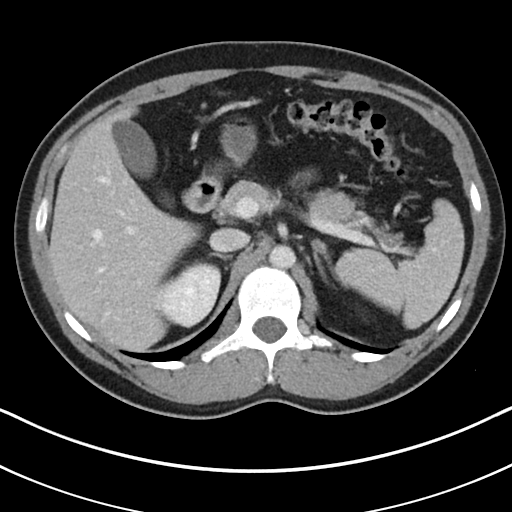
[im 79/96  soft-tissue]
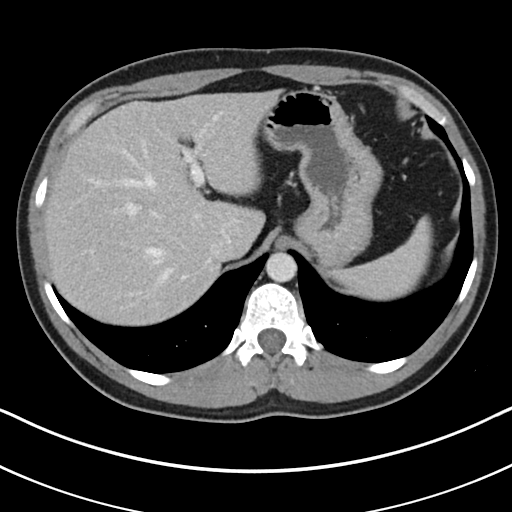
[im 83/96  soft-tissue]
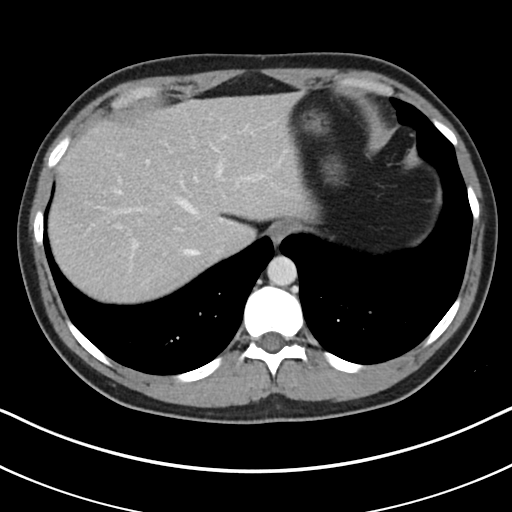
[im 91/96  soft-tissue]
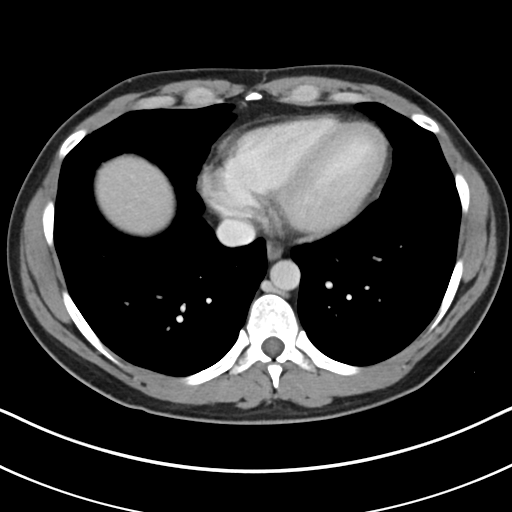

[Series 6: coronal soft tissue · coronal · 0.83mm/px · 3 of 94 slices shown]
[im 32/94  soft-tissue]
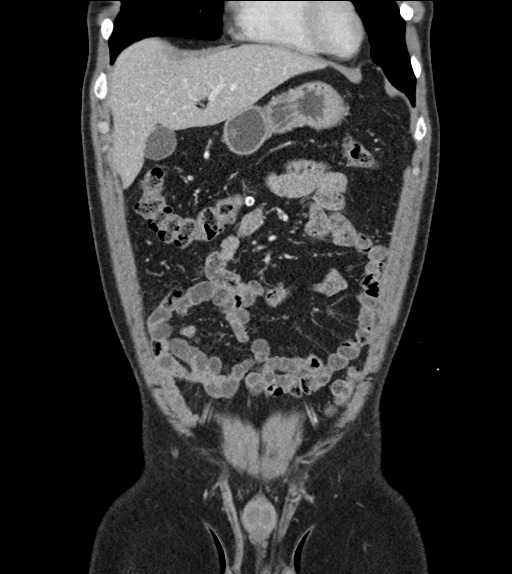
[im 42/94  soft-tissue]
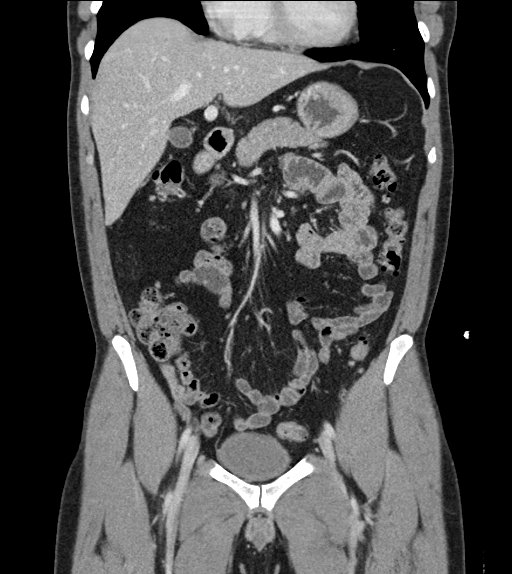
[im 52/94  soft-tissue]
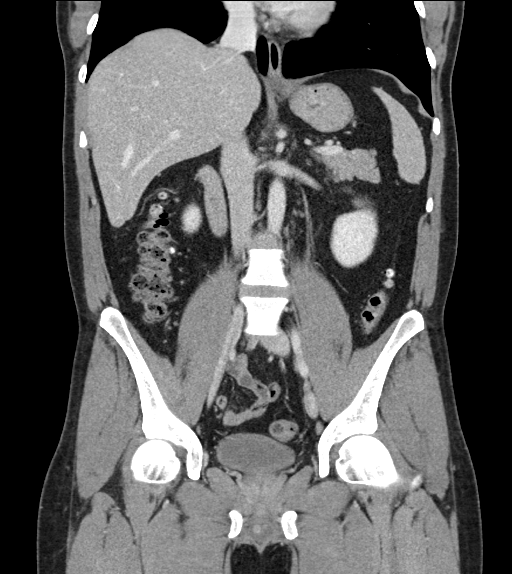

[17 of 46 positions shown; findings below may reference images not displayed]

FINDINGS: Lower chest: No acute abnormality.

Hepatobiliary: No focal liver abnormality is seen. No gallstones,
gallbladder wall thickening, or biliary dilatation.

Pancreas: Unremarkable. No pancreatic ductal dilatation or
surrounding inflammatory changes.

Spleen: Normal in size without focal abnormality.

Adrenals/Urinary Tract: Adrenal glands are unremarkable. Kidneys are
normal, without renal calculi, focal lesion, or hydronephrosis.
Bladder is unremarkable.

Stomach/Bowel: Stomach is within normal limits. Appendix appears
normal. No evidence of bowel wall thickening, distention, or
inflammatory changes. Changes of colonic diverticulosis without
diverticulitis.

Vascular/Lymphatic: No significant vascular findings are present. No
enlarged abdominal or pelvic lymph nodes.

Reproductive: Prostate is unremarkable.

Other: No abdominal wall hernia or abnormality. No abdominopelvic
ascites.

Musculoskeletal: No acute or significant osseous findings.
IMPRESSION: No evidence for appendicitis. No acute intra-abdominal or pelvic
findings.
